# Patient Record
Sex: Male | Born: 1990 | Race: Black or African American | Hispanic: No | Marital: Single | Smoking: Current every day smoker
Health system: Southern US, Community
[De-identification: ages and names within clinical notes are randomized; demographics above are authoritative.]

## PROBLEM LIST (undated history)

## (undated) DIAGNOSIS — W3400XA Accidental discharge from unspecified firearms or gun, initial encounter: Secondary | ICD-10-CM

## (undated) HISTORY — PX: MOUTH SURGERY: SHX715

---

## 2017-03-22 ENCOUNTER — Encounter (HOSPITAL_COMMUNITY)
Admission: EM | Disposition: A | Discharge status: Home Health | Payer: Self-pay | Source: Home / Self Care | Attending: Student

## 2017-03-22 ENCOUNTER — Inpatient Hospital Stay (HOSPITAL_COMMUNITY)
Admission: EM | Admit: 2017-03-22 | Discharge: 2017-03-24 | DRG: 482 | Disposition: A | Discharge status: Home Health | Payer: 59 | Attending: Student | Admitting: Student

## 2017-03-22 ENCOUNTER — Inpatient Hospital Stay (HOSPITAL_COMMUNITY): Payer: 59 | Admitting: Certified Registered"

## 2017-03-22 ENCOUNTER — Inpatient Hospital Stay (HOSPITAL_COMMUNITY): Payer: 59

## 2017-03-22 ENCOUNTER — Emergency Department (HOSPITAL_COMMUNITY): Payer: 59

## 2017-03-22 ENCOUNTER — Other Ambulatory Visit: Payer: Self-pay

## 2017-03-22 ENCOUNTER — Encounter (HOSPITAL_COMMUNITY): Payer: Self-pay | Admitting: Emergency Medicine

## 2017-03-22 DIAGNOSIS — S41132A Puncture wound without foreign body of left upper arm, initial encounter: Secondary | ICD-10-CM | POA: Diagnosis present

## 2017-03-22 DIAGNOSIS — S72352B Displaced comminuted fracture of shaft of left femur, initial encounter for open fracture type I or II: Secondary | ICD-10-CM | POA: Diagnosis not present

## 2017-03-22 DIAGNOSIS — S8992XA Unspecified injury of left lower leg, initial encounter: Secondary | ICD-10-CM | POA: Diagnosis not present

## 2017-03-22 DIAGNOSIS — F17200 Nicotine dependence, unspecified, uncomplicated: Secondary | ICD-10-CM | POA: Diagnosis present

## 2017-03-22 DIAGNOSIS — S72352A Displaced comminuted fracture of shaft of left femur, initial encounter for closed fracture: Secondary | ICD-10-CM | POA: Diagnosis not present

## 2017-03-22 DIAGNOSIS — S79002A Unspecified physeal fracture of upper end of left femur, initial encounter for closed fracture: Secondary | ICD-10-CM | POA: Diagnosis not present

## 2017-03-22 DIAGNOSIS — S7292XB Unspecified fracture of left femur, initial encounter for open fracture type I or II: Secondary | ICD-10-CM

## 2017-03-22 DIAGNOSIS — T1490XA Injury, unspecified, initial encounter: Secondary | ICD-10-CM

## 2017-03-22 DIAGNOSIS — S51802A Unspecified open wound of left forearm, initial encounter: Secondary | ICD-10-CM | POA: Diagnosis not present

## 2017-03-22 DIAGNOSIS — S59902A Unspecified injury of left elbow, initial encounter: Secondary | ICD-10-CM | POA: Diagnosis not present

## 2017-03-22 DIAGNOSIS — R410 Disorientation, unspecified: Secondary | ICD-10-CM | POA: Diagnosis not present

## 2017-03-22 DIAGNOSIS — W3400XA Accidental discharge from unspecified firearms or gun, initial encounter: Secondary | ICD-10-CM

## 2017-03-22 DIAGNOSIS — S71102A Unspecified open wound, left thigh, initial encounter: Secondary | ICD-10-CM | POA: Diagnosis not present

## 2017-03-22 DIAGNOSIS — I959 Hypotension, unspecified: Secondary | ICD-10-CM | POA: Diagnosis not present

## 2017-03-22 DIAGNOSIS — T148XXA Other injury of unspecified body region, initial encounter: Secondary | ICD-10-CM | POA: Diagnosis not present

## 2017-03-22 DIAGNOSIS — S81802A Unspecified open wound, left lower leg, initial encounter: Secondary | ICD-10-CM | POA: Diagnosis not present

## 2017-03-22 DIAGNOSIS — S7292XA Unspecified fracture of left femur, initial encounter for closed fracture: Secondary | ICD-10-CM

## 2017-03-22 DIAGNOSIS — S51842A Puncture wound with foreign body of left forearm, initial encounter: Secondary | ICD-10-CM | POA: Diagnosis not present

## 2017-03-22 DIAGNOSIS — S51832A Puncture wound without foreign body of left forearm, initial encounter: Secondary | ICD-10-CM

## 2017-03-22 DIAGNOSIS — S72002A Fracture of unspecified part of neck of left femur, initial encounter for closed fracture: Secondary | ICD-10-CM | POA: Diagnosis not present

## 2017-03-22 DIAGNOSIS — M79652 Pain in left thigh: Secondary | ICD-10-CM | POA: Diagnosis present

## 2017-03-22 HISTORY — PX: FEMUR IM NAIL: SHX1597

## 2017-03-22 HISTORY — DX: Accidental discharge from unspecified firearms or gun, initial encounter: W34.00XA

## 2017-03-22 LAB — CBC WITH DIFFERENTIAL/PLATELET
BASOS ABS: 0 10*3/uL (ref 0.0–0.1)
BASOS PCT: 0 %
EOS ABS: 0 10*3/uL (ref 0.0–0.7)
Eosinophils Relative: 0 %
HCT: 35.8 % — ABNORMAL LOW (ref 39.0–52.0)
HEMOGLOBIN: 12 g/dL — AB (ref 13.0–17.0)
LYMPHS PCT: 12 %
Lymphs Abs: 1.2 10*3/uL (ref 0.7–4.0)
MCH: 30.7 pg (ref 26.0–34.0)
MCHC: 33.5 g/dL (ref 30.0–36.0)
MCV: 91.6 fL (ref 78.0–100.0)
Monocytes Absolute: 0.7 10*3/uL (ref 0.1–1.0)
Monocytes Relative: 6 %
Neutro Abs: 8.5 10*3/uL — ABNORMAL HIGH (ref 1.7–7.7)
Neutrophils Relative %: 82 %
Platelets: 216 10*3/uL (ref 150–400)
RBC: 3.91 MIL/uL — AB (ref 4.22–5.81)
RDW: 15.1 % (ref 11.5–15.5)
WBC: 10.5 10*3/uL (ref 4.0–10.5)

## 2017-03-22 LAB — SURGICAL PCR SCREEN
MRSA, PCR: NEGATIVE
STAPHYLOCOCCUS AUREUS: NEGATIVE

## 2017-03-22 LAB — BASIC METABOLIC PANEL
Anion gap: 14 (ref 5–15)
BUN: 7 mg/dL (ref 6–20)
CHLORIDE: 104 mmol/L (ref 101–111)
CO2: 20 mmol/L — AB (ref 22–32)
CREATININE: 1.07 mg/dL (ref 0.61–1.24)
Calcium: 8 mg/dL — ABNORMAL LOW (ref 8.9–10.3)
GFR calc Af Amer: >60 mL/min (ref >60)
GFR calc non Af Amer: >60 mL/min (ref >60)
Glucose, Bld: 87 mg/dL (ref 65–99)
Potassium: 3.7 mmol/L (ref 3.5–5.1)
Sodium: 138 mmol/L (ref 135–145)

## 2017-03-22 LAB — ETHANOL: Alcohol, Ethyl (B): 118 mg/dL — ABNORMAL HIGH (ref <10)

## 2017-03-22 SURGERY — INSERTION, INTRAMEDULLARY ROD, FEMUR
Anesthesia: General | Site: Leg Upper | Laterality: Left

## 2017-03-22 MED ORDER — SUGAMMADEX SODIUM 200 MG/2ML IV SOLN
INTRAVENOUS | Status: AC
  Filled 2017-03-22: qty 2

## 2017-03-22 MED ORDER — CEFAZOLIN SODIUM-DEXTROSE 2-4 GM/100ML-% IV SOLN
INTRAVENOUS | Status: AC
  Filled 2017-03-22: qty 100

## 2017-03-22 MED ORDER — CEFAZOLIN SODIUM-DEXTROSE 2-4 GM/100ML-% IV SOLN
2.0000 g | INTRAVENOUS | Status: AC
  Administered 2017-03-22: 2 g via INTRAVENOUS

## 2017-03-22 MED ORDER — SUCCINYLCHOLINE CHLORIDE 20 MG/ML IJ SOLN
INTRAMUSCULAR | Status: DC | PRN
  Administered 2017-03-22: 100 mg via INTRAVENOUS

## 2017-03-22 MED ORDER — ACETAMINOPHEN 650 MG RE SUPP: 650.0000 mg | Freq: Four times a day (QID) | RECTAL | Status: DC | PRN

## 2017-03-22 MED ORDER — CEFAZOLIN SODIUM-DEXTROSE 2-4 GM/100ML-% IV SOLN
2.0000 g | Freq: Three times a day (TID) | INTRAVENOUS | Status: AC
  Administered 2017-03-22 – 2017-03-23 (×3): 2 g via INTRAVENOUS
  Filled 2017-03-22 (×3): qty 100

## 2017-03-22 MED ORDER — LIDOCAINE HCL (CARDIAC) 20 MG/ML IV SOLN
INTRAVENOUS | Status: DC | PRN
  Administered 2017-03-22: 80 mg via INTRAVENOUS

## 2017-03-22 MED ORDER — PROPOFOL 10 MG/ML IV BOLUS
INTRAVENOUS | Status: AC
  Filled 2017-03-22: qty 20

## 2017-03-22 MED ORDER — HYDROMORPHONE HCL 1 MG/ML IJ SOLN: 1.0000 mg | Freq: Once | INTRAMUSCULAR | Status: DC

## 2017-03-22 MED ORDER — DIPHENHYDRAMINE HCL 12.5 MG/5ML PO ELIX: 12.5000 mg | ORAL_SOLUTION | ORAL | Status: DC | PRN

## 2017-03-22 MED ORDER — VANCOMYCIN HCL 500 MG IV SOLR
INTRAVENOUS | Status: AC
  Filled 2017-03-22: qty 500

## 2017-03-22 MED ORDER — PROMETHAZINE HCL 25 MG/ML IJ SOLN: 6.2500 mg | INTRAMUSCULAR | Status: DC | PRN

## 2017-03-22 MED ORDER — ROCURONIUM BROMIDE 100 MG/10ML IV SOLN
INTRAVENOUS | Status: DC | PRN
  Administered 2017-03-22: 30 mg via INTRAVENOUS

## 2017-03-22 MED ORDER — FENTANYL CITRATE (PF) 250 MCG/5ML IJ SOLN
INTRAMUSCULAR | Status: AC
  Filled 2017-03-22: qty 5

## 2017-03-22 MED ORDER — VANCOMYCIN HCL 500 MG IV SOLR
INTRAVENOUS | Status: DC | PRN
  Administered 2017-03-22: 500 mg via TOPICAL

## 2017-03-22 MED ORDER — PROPOFOL 10 MG/ML IV BOLUS
INTRAVENOUS | Status: DC | PRN
  Administered 2017-03-22: 180 mg via INTRAVENOUS

## 2017-03-22 MED ORDER — 0.9 % SODIUM CHLORIDE (POUR BTL) OPTIME
TOPICAL | Status: DC | PRN
  Administered 2017-03-22: 1000 mL

## 2017-03-22 MED ORDER — ACETAMINOPHEN 325 MG PO TABS
650.0000 mg | ORAL_TABLET | Freq: Four times a day (QID) | ORAL | Status: DC | PRN
  Administered 2017-03-23 – 2017-03-24 (×3): 650 mg via ORAL
  Filled 2017-03-22 (×3): qty 2

## 2017-03-22 MED ORDER — PHENYLEPHRINE 40 MCG/ML (10ML) SYRINGE FOR IV PUSH (FOR BLOOD PRESSURE SUPPORT)
PREFILLED_SYRINGE | INTRAVENOUS | Status: DC | PRN
  Administered 2017-03-22: 40 ug via INTRAVENOUS
  Administered 2017-03-22: 80 ug via INTRAVENOUS

## 2017-03-22 MED ORDER — DEXAMETHASONE SODIUM PHOSPHATE 10 MG/ML IJ SOLN
INTRAMUSCULAR | Status: DC | PRN
  Administered 2017-03-22: 4 mg via INTRAVENOUS

## 2017-03-22 MED ORDER — MIDAZOLAM HCL 2 MG/2ML IJ SOLN
INTRAMUSCULAR | Status: AC
  Filled 2017-03-22: qty 2

## 2017-03-22 MED ORDER — MIDAZOLAM HCL 2 MG/2ML IJ SOLN
INTRAMUSCULAR | Status: DC | PRN
  Administered 2017-03-22: 2 mg via INTRAVENOUS

## 2017-03-22 MED ORDER — FENTANYL CITRATE (PF) 100 MCG/2ML IJ SOLN
INTRAMUSCULAR | Status: DC | PRN
  Administered 2017-03-22 (×2): 50 ug via INTRAVENOUS
  Administered 2017-03-22: 150 ug via INTRAVENOUS

## 2017-03-22 MED ORDER — ONDANSETRON HCL 4 MG PO TABS: 4.0000 mg | ORAL_TABLET | Freq: Four times a day (QID) | ORAL | Status: DC | PRN

## 2017-03-22 MED ORDER — SUGAMMADEX SODIUM 200 MG/2ML IV SOLN
INTRAVENOUS | Status: DC | PRN
  Administered 2017-03-22: 150 mg via INTRAVENOUS

## 2017-03-22 MED ORDER — ONDANSETRON HCL 4 MG/2ML IJ SOLN
4.0000 mg | Freq: Four times a day (QID) | INTRAMUSCULAR | Status: DC | PRN
  Administered 2017-03-22: 4 mg via INTRAVENOUS

## 2017-03-22 MED ORDER — METHOCARBAMOL 1000 MG/10ML IJ SOLN
500.0000 mg | Freq: Four times a day (QID) | INTRAVENOUS | Status: DC | PRN
  Filled 2017-03-22: qty 5

## 2017-03-22 MED ORDER — FENTANYL CITRATE (PF) 100 MCG/2ML IJ SOLN
INTRAMUSCULAR | Status: AC
  Filled 2017-03-22: qty 2

## 2017-03-22 MED ORDER — MORPHINE SULFATE (PF) 4 MG/ML IV SOLN
2.0000 mg | INTRAVENOUS | Status: DC | PRN
  Administered 2017-03-22 (×3): 2 mg via INTRAVENOUS
  Filled 2017-03-22 (×3): qty 1

## 2017-03-22 MED ORDER — PHENYLEPHRINE HCL 10 MG/ML IJ SOLN
INTRAVENOUS | Status: DC | PRN
  Administered 2017-03-22: 25 ug/min via INTRAVENOUS

## 2017-03-22 MED ORDER — FENTANYL CITRATE (PF) 100 MCG/2ML IJ SOLN
25.0000 ug | INTRAMUSCULAR | Status: DC | PRN
  Administered 2017-03-22: 50 ug via INTRAVENOUS

## 2017-03-22 MED ORDER — METHOCARBAMOL 500 MG PO TABS
500.0000 mg | ORAL_TABLET | Freq: Four times a day (QID) | ORAL | Status: DC | PRN
  Administered 2017-03-22 – 2017-03-24 (×5): 500 mg via ORAL
  Filled 2017-03-22 (×5): qty 1

## 2017-03-22 MED ORDER — OXYCODONE HCL 5 MG PO TABS
5.0000 mg | ORAL_TABLET | ORAL | Status: DC | PRN
  Administered 2017-03-22 – 2017-03-24 (×8): 10 mg via ORAL
  Filled 2017-03-22 (×8): qty 2

## 2017-03-22 MED ORDER — DEXAMETHASONE SODIUM PHOSPHATE 10 MG/ML IJ SOLN
INTRAMUSCULAR | Status: AC
  Filled 2017-03-22: qty 1

## 2017-03-22 MED ORDER — LACTATED RINGERS IV SOLN
INTRAVENOUS | Status: DC
  Administered 2017-03-22 (×2): via INTRAVENOUS
  Administered 2017-03-22: 10 mL via INTRAVENOUS

## 2017-03-22 MED ORDER — MUPIROCIN 2 % EX OINT
1.0000 "application " | TOPICAL_OINTMENT | Freq: Two times a day (BID) | CUTANEOUS | Status: DC
  Administered 2017-03-23 – 2017-03-24 (×3): 1 via NASAL
  Filled 2017-03-22: qty 22

## 2017-03-22 SURGICAL SUPPLY — 57 items
BANDAGE ACE 4X5 VEL STRL LF (GAUZE/BANDAGES/DRESSINGS) IMPLANT
BANDAGE ACE 6X5 VEL STRL LF (GAUZE/BANDAGES/DRESSINGS) IMPLANT
BANDAGE ELASTIC 6 VELCRO ST LF (GAUZE/BANDAGES/DRESSINGS) IMPLANT
BIT DRILL 4.2 (DRILL) ×1 IMPLANT
BIT DRILL FLUTED FEMUR 4.2/3 (BIT) ×3 IMPLANT
BLADE SURG 10 STRL SS (BLADE) ×3 IMPLANT
BNDG COHESIVE 4X5 TAN STRL (GAUZE/BANDAGES/DRESSINGS) ×3 IMPLANT
BNDG COHESIVE 6X5 TAN STRL LF (GAUZE/BANDAGES/DRESSINGS) ×3 IMPLANT
BRUSH SCRUB SURG 4.25 DISP (MISCELLANEOUS) ×6 IMPLANT
CHLORAPREP W/TINT 26ML (MISCELLANEOUS) ×3 IMPLANT
COVER SURGICAL LIGHT HANDLE (MISCELLANEOUS) ×3 IMPLANT
DRAPE C-ARM 35X43 STRL (DRAPES) ×3 IMPLANT
DRAPE C-ARMOR (DRAPES) ×3 IMPLANT
DRAPE HALF SHEET 40X57 (DRAPES) ×6 IMPLANT
DRAPE IMP U-DRAPE 54X76 (DRAPES) ×6 IMPLANT
DRAPE INCISE IOBAN 66X45 STRL (DRAPES) ×3 IMPLANT
DRAPE ORTHO SPLIT 77X108 STRL (DRAPES) ×4
DRAPE SURG 17X23 STRL (DRAPES) ×3 IMPLANT
DRAPE SURG ORHT 6 SPLT 77X108 (DRAPES) ×2 IMPLANT
DRAPE U-SHAPE 47X51 STRL (DRAPES) ×3 IMPLANT
DRILL 4.2 (DRILL) ×3
DRSG MEPILEX BORDER 4X4 (GAUZE/BANDAGES/DRESSINGS) ×15 IMPLANT
DRSG MEPILEX BORDER 4X8 (GAUZE/BANDAGES/DRESSINGS) IMPLANT
ELECT REM PT RETURN 9FT ADLT (ELECTROSURGICAL) ×3
ELECTRODE REM PT RTRN 9FT ADLT (ELECTROSURGICAL) ×1 IMPLANT
GLOVE BIO SURGEON STRL SZ7.5 (GLOVE) ×18 IMPLANT
GLOVE BIOGEL PI IND STRL 7.5 (GLOVE) ×1 IMPLANT
GLOVE BIOGEL PI INDICATOR 7.5 (GLOVE) ×2
GOWN STRL REUS W/ TWL LRG LVL3 (GOWN DISPOSABLE) ×3 IMPLANT
GOWN STRL REUS W/TWL LRG LVL3 (GOWN DISPOSABLE) ×6
GOWN STRL REUS W/TWL XL LVL3 (GOWN DISPOSABLE) ×3 IMPLANT
GUIDEWIRE 3.2X400 (WIRE) ×9 IMPLANT
KIT BASIN OR (CUSTOM PROCEDURE TRAY) ×3 IMPLANT
KIT ROOM TURNOVER OR (KITS) ×3 IMPLANT
MANIFOLD NEPTUNE II (INSTRUMENTS) ×3 IMPLANT
NAIL CANN FRN 11X420MM LEFT (Nail) ×3 IMPLANT
NS IRRIG 1000ML POUR BTL (IV SOLUTION) ×3 IMPLANT
PACK GENERAL/GYN (CUSTOM PROCEDURE TRAY) ×3 IMPLANT
PAD ARMBOARD 7.5X6 YLW CONV (MISCELLANEOUS) ×6 IMPLANT
ROD REAMER STRT BALL 3.0X950 (MISCELLANEOUS) ×3 IMPLANT
SCREW 6.5MM W/T25 STARDRIVE (BIT) ×3 IMPLANT
SCREW LOCK STAR 5X42 (Screw) ×3 IMPLANT
SCREW LOCKING 5.0MMX40MM (Screw) ×3 IMPLANT
SCREW LOCKING 5.0X48MM (Screw) ×3 IMPLANT
SCREW RECON 6.5X100MM (Screw) ×3 IMPLANT
STAPLER VISISTAT 35W (STAPLE) ×3 IMPLANT
STOCKINETTE IMPERVIOUS LG (DRAPES) ×3 IMPLANT
SUT ETHILON 3 0 PS 1 (SUTURE) ×9 IMPLANT
SUT MNCRL AB 3-0 PS2 18 (SUTURE) IMPLANT
SUT VIC AB 0 CT1 27 (SUTURE)
SUT VIC AB 0 CT1 27XBRD ANBCTR (SUTURE) IMPLANT
SUT VIC AB 2-0 CT1 27 (SUTURE) ×4
SUT VIC AB 2-0 CT1 TAPERPNT 27 (SUTURE) ×2 IMPLANT
TOWEL OR 17X24 6PK STRL BLUE (TOWEL DISPOSABLE) ×6 IMPLANT
TOWEL OR 17X26 10 PK STRL BLUE (TOWEL DISPOSABLE) ×6 IMPLANT
UNDERPAD 30X30 (UNDERPADS AND DIAPERS) ×3 IMPLANT
WATER STERILE IRR 1000ML POUR (IV SOLUTION) ×3 IMPLANT
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
## 2017-03-22 NOTE — Anesthesia Postprocedure Evaluation (Signed)
Anesthesia Post Note  Patient: Louis Roberts  Procedure(s) Performed: INTRAMEDULLARY (IM) NAIL FEMORAL (Left Leg Upper)     Patient location during evaluation: PACU Anesthesia Type: General Level of consciousness: awake and alert Pain management: pain level controlled Vital Signs Assessment: post-procedure vital signs reviewed and stable Respiratory status: spontaneous breathing, nonlabored ventilation and respiratory function stable Cardiovascular status: blood pressure returned to baseline and stable Postop Assessment: no apparent nausea or vomiting Anesthetic complications: no    Last Vitals:  Vitals:   03/22/17 1554 03/22/17 1615  BP: (!) 146/84 (!) 156/61  Pulse: (!) 58 61  Resp: 14 16  Temp:  37.4 C  SpO2: 95% 100%    Last Pain:  Vitals:   03/22/17 1615  TempSrc: Oral  PainSc:                  Cecile HearingStephen Edward Kaneisha Ellenberger

## 2017-03-22 NOTE — Transfer of Care (Signed)
Immediate Anesthesia Transfer of Care Note  Patient: Louis RhodesJeronico D XXXHairston  Procedure(s) Performed: INTRAMEDULLARY (IM) NAIL FEMORAL (Left Leg Upper)  Patient Location: PACU  Anesthesia Type:General  Level of Consciousness: sedated and patient cooperative  Airway & Oxygen Therapy: Patient Spontanous Breathing  Post-op Assessment: Report given to RN and Post -op Vital signs reviewed and stable  Post vital signs: Reviewed  Last Vitals: 144/75, 74, 16, 97% RA Vitals:   03/22/17 0823 03/22/17 1454  BP: (!) 143/65 (!) 144/75  Pulse: 63 85  Resp: 16 17  Temp: 37 C 36.6 C  SpO2: 99% 95%    Last Pain:  Vitals:   03/22/17 0823  TempSrc: Axillary  PainSc:          Complications: No apparent anesthesia complications
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
## 2017-03-22 NOTE — ED Notes (Signed)
Mother at bedside speaking with Ortho MD

## 2017-03-22 NOTE — ED Notes (Signed)
Dr. Judd Lienelo notified of request for pain medication.
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
## 2017-03-22 NOTE — Anesthesia Procedure Notes (Signed)
Procedure Name: Intubation Date/Time: 03/22/2017 12:22 PM Performed by: Sammie Bench, CRNA Pre-anesthesia Checklist: Patient identified, Emergency Drugs available, Suction available and Patient being monitored Patient Re-evaluated:Patient Re-evaluated prior to induction Oxygen Delivery Method: Circle System Utilized Preoxygenation: Pre-oxygenation with 100% oxygen Induction Type: IV induction, Rapid sequence and Cricoid Pressure applied Ventilation: Mask ventilation without difficulty Laryngoscope Size: Mac and 3 Grade View: Grade II Tube type: Oral Tube size: 7.5 mm Number of attempts: 1 Airway Equipment and Method: Stylet and Oral airway Placement Confirmation: ETT inserted through vocal cords under direct vision,  positive ETCO2 and breath sounds checked- equal and bilateral Tube secured with: Tape Dental Injury: Teeth and Oropharynx as per pre-operative assessment

## 2017-03-22 NOTE — ED Notes (Signed)
Xray at bedside for portable xrays. 

## 2017-03-22 NOTE — ED Notes (Signed)
No time set for case in OR at this time.

## 2017-03-23 ENCOUNTER — Encounter (HOSPITAL_COMMUNITY): Payer: Self-pay | Admitting: *Deleted

## 2017-03-23 LAB — CBC
HCT: 31.1 % — ABNORMAL LOW (ref 39.0–52.0)
HEMOGLOBIN: 10.5 g/dL — AB (ref 13.0–17.0)
MCH: 30.3 pg (ref 26.0–34.0)
MCHC: 33.8 g/dL (ref 30.0–36.0)
MCV: 89.6 fL (ref 78.0–100.0)
Platelets: 208 10*3/uL (ref 150–400)
RBC: 3.47 MIL/uL — ABNORMAL LOW (ref 4.22–5.81)
RDW: 14.2 % (ref 11.5–15.5)
WBC: 10 10*3/uL (ref 4.0–10.5)

## 2017-03-23 LAB — HIV ANTIBODY (ROUTINE TESTING W REFLEX): HIV Screen 4th Generation wRfx: NONREACTIVE

## 2017-03-23 MED ORDER — ASPIRIN 325 MG PO TABS
325.0000 mg | ORAL_TABLET | Freq: Every day | ORAL | Status: DC
  Administered 2017-03-23 – 2017-03-24 (×2): 325 mg via ORAL
  Filled 2017-03-23 (×2): qty 1

## 2017-03-23 NOTE — Progress Notes (Signed)
Orthopaedic Trauma Service (OTS)  1 Day Post-Op Procedure(s) (LRB): INTRAMEDULLARY (IM) NAIL FEMORAL (Left)  Subjective: Patient reports pain as moderate.    Objective: Current Vitals Blood pressure 132/74, pulse 69, temperature 98.9 F (37.2 C), temperature source Oral, resp. rate 16, height 6\' 1"  (1.854 m), weight 74.4 kg (164 lb), SpO2 97 %. Vital signs in last 24 hours: Temp:  [97.9 F (36.6 C)-99.3 F (37.4 C)] 98.9 F (37.2 C) (03/17 0514) Pulse Rate:  [55-85] 69 (03/17 0514) Resp:  [13-17] 16 (03/17 0514) BP: (132-156)/(61-84) 132/74 (03/17 0514) SpO2:  [93 %-100 %] 97 % (03/17 0514)  Intake/Output from previous day: 03/16 0701 - 03/17 0700 In: 640 [P.O.:240; I.V.:400] Out: 2380 [Urine:2130; Blood:250]  LABS Recent Labs    03/22/17 0442 03/23/17 0932  HGB 12.0* 10.5*   Recent Labs    03/22/17 0442 03/23/17 0932  WBC 10.5 10.0  RBC 3.91* 3.47*  HCT 35.8* 31.1*  PLT 216 208   Recent Labs    03/22/17 0442  NA 138  K 3.7  CL 104  CO2 20*  BUN 7  CREATININE 1.07  GLUCOSE 87  CALCIUM 8.0*   No results for input(s): LABPT, INR in the last 72 hours.   Physical Exam L arm drsg with scant drainage, edema mild LLE  Dressing intact, min drainage, dry  Edema/ swelling controlled  Sens: DPN, SPN, TN intact  Motor: EHL, FHL, and lessor toe ext and flex all intact grossly  Brisk cap refill, warm to touch  Assessment/Plan: 1 Day Post-Op Procedure(s) (LRB): INTRAMEDULLARY (IM) NAIL FEMORAL (Left) 1. PT/OT TDWB 2. DVT proph Other (comment) ASA 3. F/u 8-14 days if d/c'd today; tomorrow anticipated  Myrene GalasMichael Dwaine Pringle, MD [facility] [phone] (p)
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
## 2017-03-24 ENCOUNTER — Encounter (HOSPITAL_COMMUNITY): Payer: Self-pay | Admitting: General Practice

## 2017-03-24 LAB — CBC WITH DIFFERENTIAL/PLATELET
Basophils Absolute: 0 10*3/uL (ref 0.0–0.1)
Basophils Relative: 0 %
Eosinophils Absolute: 0.1 10*3/uL (ref 0.0–0.7)
Eosinophils Relative: 1 %
HEMATOCRIT: 27 % — AB (ref 39.0–52.0)
Hemoglobin: 9 g/dL — ABNORMAL LOW (ref 13.0–17.0)
LYMPHS ABS: 1.4 10*3/uL (ref 0.7–4.0)
LYMPHS PCT: 18 %
MCH: 30.3 pg (ref 26.0–34.0)
MCHC: 33.3 g/dL (ref 30.0–36.0)
MCV: 90.9 fL (ref 78.0–100.0)
Monocytes Absolute: 0.8 10*3/uL (ref 0.1–1.0)
Monocytes Relative: 10 %
NEUTROS ABS: 5.8 10*3/uL (ref 1.7–7.7)
NEUTROS PCT: 71 %
Platelets: 179 10*3/uL (ref 150–400)
RBC: 2.97 MIL/uL — AB (ref 4.22–5.81)
RDW: 14.1 % (ref 11.5–15.5)
WBC: 8.2 10*3/uL (ref 4.0–10.5)

## 2017-03-24 MED ORDER — METHOCARBAMOL 750 MG PO TABS: 750.0000 mg | ORAL_TABLET | Freq: Four times a day (QID) | ORAL | 0 refills | Status: DC | PRN

## 2017-03-24 MED ORDER — METHOCARBAMOL 750 MG PO TABS: 750.0000 mg | ORAL_TABLET | Freq: Four times a day (QID) | ORAL | 0 refills | Status: AC | PRN

## 2017-03-24 MED ORDER — OXYCODONE-ACETAMINOPHEN 5-325 MG PO TABS: 1.0000 | ORAL_TABLET | ORAL | 0 refills | Status: AC | PRN

## 2017-03-24 MED ORDER — ASPIRIN 325 MG PO TABS: 325.0000 mg | ORAL_TABLET | Freq: Every day | ORAL | 0 refills | Status: DC

## 2017-03-24 MED ORDER — OXYCODONE-ACETAMINOPHEN 5-325 MG PO TABS: 1.0000 | ORAL_TABLET | ORAL | 0 refills | Status: DC | PRN

## 2017-03-24 MED ORDER — ASPIRIN 325 MG PO TABS: 325.0000 mg | ORAL_TABLET | Freq: Every day | ORAL | 0 refills | Status: AC
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
## 2017-03-24 NOTE — Progress Notes (Signed)
This nurse called to PT room d/t pt feeling light headed and needing to sit during treatment.  Orthostatic BP's taken see flow sheet.  Pt awake, alert laying in bed with mother at bedside.  [id number]
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
## 2017-03-24 NOTE — Progress Notes (Signed)
Orthopedic Tech Progress Note Patient Details:  [name] February 02, 1990 [id number]  Ortho Devices Type of Ortho Device: Crutches   Post Interventions Instructions Provided: Care of device   Saul FordyceJennifer C Karrigan Messamore 03/24/2017, 1:17 PM
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
## 2017-03-25 ENCOUNTER — Other Ambulatory Visit: Payer: Self-pay | Admitting: *Deleted

## 2017-03-25 ENCOUNTER — Encounter: Payer: Self-pay | Admitting: *Deleted
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
## 2017-04-01 DIAGNOSIS — S81802A Unspecified open wound, left lower leg, initial encounter: Secondary | ICD-10-CM | POA: Diagnosis not present

## 2017-04-01 DIAGNOSIS — S7292XB Unspecified fracture of left femur, initial encounter for open fracture type I or II: Secondary | ICD-10-CM | POA: Diagnosis not present

## 2017-04-04 DIAGNOSIS — S7292XB Unspecified fracture of left femur, initial encounter for open fracture type I or II: Secondary | ICD-10-CM | POA: Diagnosis not present

## 2017-04-04 DIAGNOSIS — S81802A Unspecified open wound, left lower leg, initial encounter: Secondary | ICD-10-CM | POA: Diagnosis not present

## 2017-04-07 DIAGNOSIS — S7292XB Unspecified fracture of left femur, initial encounter for open fracture type I or II: Secondary | ICD-10-CM | POA: Diagnosis not present

## 2017-04-07 DIAGNOSIS — S81802A Unspecified open wound, left lower leg, initial encounter: Secondary | ICD-10-CM | POA: Diagnosis not present

## 2017-04-10 DIAGNOSIS — S81802A Unspecified open wound, left lower leg, initial encounter: Secondary | ICD-10-CM | POA: Diagnosis not present

## 2017-04-10 DIAGNOSIS — S7292XB Unspecified fracture of left femur, initial encounter for open fracture type I or II: Secondary | ICD-10-CM | POA: Diagnosis not present

## 2017-04-14 DIAGNOSIS — S72352F Displaced comminuted fracture of shaft of left femur, subsequent encounter for open fracture type IIIA, IIIB, or IIIC with routine healing: Secondary | ICD-10-CM | POA: Diagnosis not present

## 2017-04-16 DIAGNOSIS — S81802A Unspecified open wound, left lower leg, initial encounter: Secondary | ICD-10-CM | POA: Diagnosis not present

## 2017-04-16 DIAGNOSIS — S7292XB Unspecified fracture of left femur, initial encounter for open fracture type I or II: Secondary | ICD-10-CM | POA: Diagnosis not present

## 2017-04-17 DIAGNOSIS — S7292XB Unspecified fracture of left femur, initial encounter for open fracture type I or II: Secondary | ICD-10-CM | POA: Diagnosis not present

## 2017-04-17 DIAGNOSIS — S81802A Unspecified open wound, left lower leg, initial encounter: Secondary | ICD-10-CM | POA: Diagnosis not present

## 2017-04-23 DIAGNOSIS — S81802A Unspecified open wound, left lower leg, initial encounter: Secondary | ICD-10-CM | POA: Diagnosis not present

## 2017-04-23 DIAGNOSIS — S7292XB Unspecified fracture of left femur, initial encounter for open fracture type I or II: Secondary | ICD-10-CM | POA: Diagnosis not present

## 2017-04-24 DIAGNOSIS — S7292XB Unspecified fracture of left femur, initial encounter for open fracture type I or II: Secondary | ICD-10-CM | POA: Diagnosis not present

## 2017-04-24 DIAGNOSIS — S81802A Unspecified open wound, left lower leg, initial encounter: Secondary | ICD-10-CM | POA: Diagnosis not present

## 2017-04-30 DIAGNOSIS — S7292XB Unspecified fracture of left femur, initial encounter for open fracture type I or II: Secondary | ICD-10-CM | POA: Diagnosis not present

## 2017-04-30 DIAGNOSIS — S81802A Unspecified open wound, left lower leg, initial encounter: Secondary | ICD-10-CM | POA: Diagnosis not present

## 2017-05-01 DIAGNOSIS — S7292XB Unspecified fracture of left femur, initial encounter for open fracture type I or II: Secondary | ICD-10-CM | POA: Diagnosis not present

## 2017-05-01 DIAGNOSIS — S81802A Unspecified open wound, left lower leg, initial encounter: Secondary | ICD-10-CM | POA: Diagnosis not present

## 2017-05-07 DIAGNOSIS — W3400XS Accidental discharge from unspecified firearms or gun, sequela: Secondary | ICD-10-CM | POA: Diagnosis not present

## 2017-05-07 DIAGNOSIS — Z713 Dietary counseling and surveillance: Secondary | ICD-10-CM | POA: Diagnosis not present

## 2017-05-07 DIAGNOSIS — M79606 Pain in leg, unspecified: Secondary | ICD-10-CM | POA: Diagnosis not present

## 2017-05-07 DIAGNOSIS — Z299 Encounter for prophylactic measures, unspecified: Secondary | ICD-10-CM | POA: Diagnosis not present

## 2017-05-07 DIAGNOSIS — Z6823 Body mass index (BMI) 23.0-23.9, adult: Secondary | ICD-10-CM | POA: Diagnosis not present

## 2017-05-08 DIAGNOSIS — S7292XB Unspecified fracture of left femur, initial encounter for open fracture type I or II: Secondary | ICD-10-CM | POA: Diagnosis not present

## 2017-05-08 DIAGNOSIS — S81802A Unspecified open wound, left lower leg, initial encounter: Secondary | ICD-10-CM | POA: Diagnosis not present

## 2017-05-09 DIAGNOSIS — S7292XB Unspecified fracture of left femur, initial encounter for open fracture type I or II: Secondary | ICD-10-CM | POA: Diagnosis not present

## 2017-05-09 DIAGNOSIS — S81802A Unspecified open wound, left lower leg, initial encounter: Secondary | ICD-10-CM | POA: Diagnosis not present

## 2017-05-13 DIAGNOSIS — S72352F Displaced comminuted fracture of shaft of left femur, subsequent encounter for open fracture type IIIA, IIIB, or IIIC with routine healing: Secondary | ICD-10-CM | POA: Diagnosis not present

## 2017-05-15 DIAGNOSIS — S7292XB Unspecified fracture of left femur, initial encounter for open fracture type I or II: Secondary | ICD-10-CM | POA: Diagnosis not present

## 2017-05-15 DIAGNOSIS — S81802A Unspecified open wound, left lower leg, initial encounter: Secondary | ICD-10-CM | POA: Diagnosis not present

## 2017-05-27 ENCOUNTER — Encounter (HOSPITAL_COMMUNITY): Payer: Self-pay

## 2017-05-27 ENCOUNTER — Ambulatory Visit (HOSPITAL_COMMUNITY): Payer: 59 | Attending: Student

## 2017-05-27 ENCOUNTER — Other Ambulatory Visit: Payer: Self-pay

## 2017-05-27 DIAGNOSIS — M6281 Muscle weakness (generalized): Secondary | ICD-10-CM | POA: Diagnosis not present

## 2017-05-27 DIAGNOSIS — R2689 Other abnormalities of gait and mobility: Secondary | ICD-10-CM | POA: Insufficient documentation

## 2017-05-27 DIAGNOSIS — M79605 Pain in left leg: Secondary | ICD-10-CM | POA: Insufficient documentation

## 2017-05-27 NOTE — Patient Instructions (Signed)
Supine Active Straight Leg Raise REPS: 10-20  SETS: 1-3  WEEKLY: 7x  DAILY: 1x Clinician Notes: squeeze your quad before raising your leg up Setup Begin lying on your back with one knee bent and your other leg straight. Movement Engaging your thigh muscles, slowly lift your straight leg until it is parallel with your other thigh, then lower it back to the starting position and repeat. Tip Make sure to keep your leg straight and do not let your back arch during the exercise. STEP 1 STEP 2 Sidelying Hip Abduction REPS: 10-20  SETS: 1-3  HOLD: 3 seconds  WEEKLY: 7x  DAILY: 1x Setup Begin lying on your side with your top leg straight and your bottom leg bent. Movement Lift your top leg up toward the ceiling, then slowly lower it back down and repeat. Tip Make sure to keep your leg straight and do not let your hips roll backward or forward during the exercise. STEP 1 STEP 2 Supine Hamstring Stretch with Strap REPS: 3  SETS: 1-3  HOLD: 30 seconds  WEEKLY: 7x  DAILY: 1x Setup Begin lying on your back with your legs straight, holding the end of a strap that is looped around one foot. Movement Use the strap to pull your leg up toward your body until you feel a gentle stretch in the back of your upper leg. Hold this position. Tip Make sure to keep your other leg straight on the ground during the stretch.
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
## 2017-06-03 ENCOUNTER — Ambulatory Visit (HOSPITAL_COMMUNITY): Payer: 59 | Admitting: Physical Therapy

## 2017-06-03 ENCOUNTER — Encounter (HOSPITAL_COMMUNITY): Payer: Self-pay | Admitting: Physical Therapy

## 2017-06-03 DIAGNOSIS — R2689 Other abnormalities of gait and mobility: Secondary | ICD-10-CM | POA: Diagnosis not present

## 2017-06-03 DIAGNOSIS — M79605 Pain in left leg: Secondary | ICD-10-CM | POA: Diagnosis not present

## 2017-06-03 DIAGNOSIS — M6281 Muscle weakness (generalized): Secondary | ICD-10-CM

## 2017-06-03 NOTE — Patient Instructions (Addendum)
Heel Raise: Bilateral (Standing)    Rise on balls of feet. Repeat _10__ times per set. Do __1__ sets per session. Do __2__ sessions per day.  http://orth.exer.us/38   Copyright  VHI. All rights reserved.  Forward Lunge    Standing with feet shoulder width apart and stomach tight, step forward with left leg. Repeat __10__ times per set. Do __1__ sets per session. Do _2___ sessions per day. 2 http://orth.exer.us/1146   Copyright  VHI. All rights reserved.  Side Lunge    Stand with knees slightly bent, stomach tight. Step to side with right leg. Repeat to left  Repeat 10____ times per set. Do __1__ sets per session. Do _2___ sessions per day.  http://orth.exer.us/1148   Copyright  VHI. All rights reserved.  Bridging    Slowly raise buttocks from floor, keeping stomach tight. Repeat __10__ times per set. Do __1__ sets per session. Do __2__ sessions per day.  http://orth.exer.us/1096   Copyright  VHI. All rights reserved.  Strengthening: Hip Abduction (Side-Lying)    Tighten muscles on front of left thigh, then lift leg _15___ inches from surface, keeping knee locked.  Repeat __10__ times per set. Do __1__ sets per session. Do __2__ sessions per day.  http://orth.exer.us/622   Copyright  VHI. All rights reserved.  Strengthening: Straight Leg Raise (Phase 1)    Tighten muscles on front of right thigh, then lift leg ___15_ inches from surface, keeping knee locked.  Repeat __10__ times per set. Do _1___ sets per session. Do __2__ sessions per day.  http://orth.exer.us/614   Copyright  VHI. All rights reserved.  Stretching: Hamstring (Supine)    Supporting right thigh behind knee, slowly straighten knee until stretch is felt in back of thigh. Hold __30__ seconds. Repeat ___3_ times per set. Do __1__ sets per session. Do _2___ sessions per day.  http://orth.exer.us/656   Copyright  VHI. All rights reserved.  Balance: Unilateral    Attempt to  balance on left leg, eyes open. Hold 30____ seconds. Repeat __5__ times per set. Do __1__ sets per session. Do ___2_ sessions per day. May use one or two finger hold   http://orth.exer.us/28   Copyright  VHI. All rights reserved.
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
## 2017-06-05 ENCOUNTER — Ambulatory Visit (HOSPITAL_COMMUNITY): Payer: 59

## 2017-06-05 ENCOUNTER — Encounter (HOSPITAL_COMMUNITY): Payer: Self-pay

## 2017-06-05 DIAGNOSIS — M79605 Pain in left leg: Secondary | ICD-10-CM | POA: Diagnosis not present

## 2017-06-05 DIAGNOSIS — M6281 Muscle weakness (generalized): Secondary | ICD-10-CM

## 2017-06-05 DIAGNOSIS — R2689 Other abnormalities of gait and mobility: Secondary | ICD-10-CM
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
## 2017-06-10 ENCOUNTER — Other Ambulatory Visit: Payer: Self-pay

## 2017-06-10 ENCOUNTER — Encounter (HOSPITAL_COMMUNITY): Payer: Self-pay

## 2017-06-10 ENCOUNTER — Ambulatory Visit (HOSPITAL_COMMUNITY): Payer: 59 | Attending: Student

## 2017-06-10 ENCOUNTER — Telehealth (HOSPITAL_COMMUNITY): Payer: Self-pay

## 2017-06-10 DIAGNOSIS — M79605 Pain in left leg: Secondary | ICD-10-CM | POA: Insufficient documentation

## 2017-06-10 DIAGNOSIS — M6281 Muscle weakness (generalized): Secondary | ICD-10-CM | POA: Diagnosis not present

## 2017-06-10 DIAGNOSIS — R2689 Other abnormalities of gait and mobility: Secondary | ICD-10-CM | POA: Insufficient documentation
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
## 2017-06-12 ENCOUNTER — Telehealth (HOSPITAL_COMMUNITY): Payer: Self-pay | Admitting: General Practice

## 2017-06-12 ENCOUNTER — Ambulatory Visit (HOSPITAL_COMMUNITY): Payer: 59 | Admitting: Physical Therapy

## 2017-06-12 NOTE — Telephone Encounter (Signed)
06/12/17  Lady caller asked that the appt be cx because he didn't have a ride

## 2017-06-17 ENCOUNTER — Ambulatory Visit (HOSPITAL_COMMUNITY): Payer: 59

## 2017-06-17 ENCOUNTER — Other Ambulatory Visit: Payer: Self-pay

## 2017-06-17 ENCOUNTER — Encounter (HOSPITAL_COMMUNITY): Payer: Self-pay

## 2017-06-17 DIAGNOSIS — R2689 Other abnormalities of gait and mobility: Secondary | ICD-10-CM

## 2017-06-17 DIAGNOSIS — M79605 Pain in left leg: Secondary | ICD-10-CM | POA: Diagnosis not present

## 2017-06-17 DIAGNOSIS — M6281 Muscle weakness (generalized): Secondary | ICD-10-CM
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
## 2017-06-19 ENCOUNTER — Other Ambulatory Visit: Payer: Self-pay

## 2017-06-19 ENCOUNTER — Encounter (HOSPITAL_COMMUNITY): Payer: Self-pay

## 2017-06-19 ENCOUNTER — Ambulatory Visit (HOSPITAL_COMMUNITY): Payer: 59

## 2017-06-19 DIAGNOSIS — M79605 Pain in left leg: Secondary | ICD-10-CM | POA: Diagnosis not present

## 2017-06-19 DIAGNOSIS — R2689 Other abnormalities of gait and mobility: Secondary | ICD-10-CM

## 2017-06-19 DIAGNOSIS — M6281 Muscle weakness (generalized): Secondary | ICD-10-CM | POA: Diagnosis not present
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
## 2017-06-24 ENCOUNTER — Ambulatory Visit (HOSPITAL_COMMUNITY): Payer: 59

## 2017-06-24 DIAGNOSIS — S72352F Displaced comminuted fracture of shaft of left femur, subsequent encounter for open fracture type IIIA, IIIB, or IIIC with routine healing: Secondary | ICD-10-CM | POA: Diagnosis not present

## 2017-06-26 ENCOUNTER — Other Ambulatory Visit: Payer: Self-pay

## 2017-06-26 ENCOUNTER — Ambulatory Visit (HOSPITAL_COMMUNITY): Payer: 59

## 2017-06-26 ENCOUNTER — Encounter (HOSPITAL_COMMUNITY): Payer: Self-pay

## 2017-06-26 DIAGNOSIS — R2689 Other abnormalities of gait and mobility: Secondary | ICD-10-CM | POA: Diagnosis not present

## 2017-06-26 DIAGNOSIS — M79605 Pain in left leg: Secondary | ICD-10-CM

## 2017-06-26 DIAGNOSIS — M6281 Muscle weakness (generalized): Secondary | ICD-10-CM
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
## 2017-07-01 ENCOUNTER — Other Ambulatory Visit: Payer: Self-pay

## 2017-07-01 ENCOUNTER — Ambulatory Visit (HOSPITAL_COMMUNITY): Payer: 59

## 2017-07-01 ENCOUNTER — Encounter (HOSPITAL_COMMUNITY): Payer: Self-pay

## 2017-07-01 DIAGNOSIS — M79605 Pain in left leg: Secondary | ICD-10-CM

## 2017-07-01 DIAGNOSIS — M6281 Muscle weakness (generalized): Secondary | ICD-10-CM | POA: Diagnosis not present

## 2017-07-01 DIAGNOSIS — R2689 Other abnormalities of gait and mobility: Secondary | ICD-10-CM
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
## 2017-07-03 ENCOUNTER — Encounter (HOSPITAL_COMMUNITY): Payer: Self-pay

## 2017-07-03 ENCOUNTER — Ambulatory Visit (HOSPITAL_COMMUNITY): Payer: 59

## 2017-07-03 DIAGNOSIS — R2689 Other abnormalities of gait and mobility: Secondary | ICD-10-CM

## 2017-07-03 DIAGNOSIS — M6281 Muscle weakness (generalized): Secondary | ICD-10-CM

## 2017-07-03 DIAGNOSIS — M79605 Pain in left leg: Secondary | ICD-10-CM
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
## 2017-07-07 ENCOUNTER — Telehealth (HOSPITAL_COMMUNITY): Payer: Self-pay | Admitting: General Practice

## 2017-07-07 ENCOUNTER — Ambulatory Visit (HOSPITAL_COMMUNITY): Payer: 59

## 2017-07-07 NOTE — Telephone Encounter (Signed)
07/07/17  lady called and asked that today's appt be changed to 7/2 because she said his appts were usually on T&Th and may not be able to get a ride today

## 2017-07-08 ENCOUNTER — Ambulatory Visit (HOSPITAL_COMMUNITY): Payer: 59 | Attending: Student | Admitting: Physical Therapy

## 2017-07-08 DIAGNOSIS — M79605 Pain in left leg: Secondary | ICD-10-CM | POA: Insufficient documentation

## 2017-07-08 DIAGNOSIS — R2689 Other abnormalities of gait and mobility: Secondary | ICD-10-CM | POA: Insufficient documentation

## 2017-07-08 DIAGNOSIS — M6281 Muscle weakness (generalized): Secondary | ICD-10-CM | POA: Diagnosis not present
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
## 2017-07-09 ENCOUNTER — Telehealth (HOSPITAL_COMMUNITY): Payer: Self-pay

## 2017-07-09 ENCOUNTER — Ambulatory Visit (HOSPITAL_COMMUNITY): Payer: 59

## 2017-07-09 NOTE — Telephone Encounter (Signed)
No Show #1: I called the patient to check in as he missed his appointment for 4:45 PM today. I informed him that his next scheduled appointment is for Monday July 8th at 5:30 PM, however for him to be re-evaluated by a physical therapist he would need to come in at 4:45 PM on that same date. I asked that if he is able to make this change for his Monday appointment that he call our front office back to confirm.  Valentino Saxonachel Quinn-Brown, PT, DPT Physical Therapist with [facility]  07/09/2017 5:12 PM

## 2017-07-14 ENCOUNTER — Telehealth (HOSPITAL_COMMUNITY): Payer: Self-pay

## 2017-07-14 ENCOUNTER — Ambulatory Visit (HOSPITAL_COMMUNITY): Payer: 59

## 2017-07-14 NOTE — Telephone Encounter (Signed)
cx today lack of transportation

## 2017-07-17 ENCOUNTER — Telehealth (HOSPITAL_COMMUNITY): Payer: Self-pay | Admitting: General Practice

## 2017-07-17 ENCOUNTER — Ambulatory Visit (HOSPITAL_COMMUNITY): Payer: 59

## 2017-07-17 NOTE — Telephone Encounter (Signed)
07/17/17  lady left a message to cx said that the patient didn't have a ride

## 2017-10-07 ENCOUNTER — Encounter (HOSPITAL_COMMUNITY): Payer: Self-pay

## 2017-10-07 NOTE — Therapy (Signed)
[place], [place], [address] Phone: [phone]   Fax:  [phone]  Patient Details  Name: Louis Roberts MRN: [id number] Date of Birth: 08-09-1990 Referring Provider:  No ref. provider found  Encounter Date: 10/07/2017   PHYSICAL THERAPY DISCHARGE SUMMARY  Visits from Start of Care: 10  Current functional level related to goals / functional outcomes: Patient is being discharged from this episode of therapy as he has not returned since his last visit. He was last seen on 07/08/17 and "no-showed" or cancelled his last 3 appointments. He did not reschedule his visits and has not contacted our office.    Remaining deficits: See last treatment note on 07/08/17   Education / Equipment: Patient had been educated on HEP throughout course of therapy and on attendance policy for therapy.  Plan: Patient agrees to discharge.  Patient goals were not met. Patient is being discharged due to meeting the stated rehab goals.  ?????      Kipp Brood, PT, DPT Physical Therapist with [facility]  10/07/2017 12:49 PM    [place], [place], [address] Phone: [phone]   Fax:  [phone]

## 2017-11-24 DIAGNOSIS — S50852D Superficial foreign body of left forearm, subsequent encounter: Secondary | ICD-10-CM | POA: Diagnosis not present

## 2017-12-09 ENCOUNTER — Encounter (HOSPITAL_COMMUNITY): Payer: Self-pay | Admitting: *Deleted

## 2017-12-09 ENCOUNTER — Other Ambulatory Visit: Payer: Self-pay

## 2017-12-09 ENCOUNTER — Other Ambulatory Visit: Payer: Self-pay | Admitting: Orthopedic Surgery

## 2017-12-09 DIAGNOSIS — M795 Residual foreign body in soft tissue: Secondary | ICD-10-CM | POA: Diagnosis not present

## 2017-12-09 DIAGNOSIS — W3400XA Accidental discharge from unspecified firearms or gun, initial encounter: Secondary | ICD-10-CM | POA: Diagnosis not present

## 2017-12-09 NOTE — Progress Notes (Signed)
Pt denies SOB, chest pain, and being under the care of a cardiologist. Pt denies having a stress test, echo and cardiac cath. Pt denies having a chest x ray and EKG within the last year. Pt denies recent labs. Pt made aware to stop taking vitamins, fish oil and herbal medications. Do not take any NSAIDs ie: Ibuprofen, Advil, Naproxen (Aleve), Motrin, BC and Goody Powder. Pt verbalized understanding of all pre-op instructions. 

## 2017-12-10 ENCOUNTER — Ambulatory Visit (HOSPITAL_COMMUNITY): Payer: 59 | Admitting: Anesthesiology

## 2017-12-10 ENCOUNTER — Encounter (HOSPITAL_COMMUNITY): Payer: Self-pay | Admitting: Urology

## 2017-12-10 ENCOUNTER — Encounter (HOSPITAL_COMMUNITY)
Admission: RE | Disposition: A | Discharge status: Home / Self Care | Payer: Self-pay | Source: Ambulatory Visit | Attending: Orthopedic Surgery

## 2017-12-10 ENCOUNTER — Ambulatory Visit (HOSPITAL_COMMUNITY)
Admission: RE | Admit: 2017-12-10 | Discharge: 2017-12-10 | Disposition: A | Discharge status: Home / Self Care | Payer: 59 | Source: Ambulatory Visit | Attending: Orthopedic Surgery | Admitting: Orthopedic Surgery

## 2017-12-10 DIAGNOSIS — F17210 Nicotine dependence, cigarettes, uncomplicated: Secondary | ICD-10-CM | POA: Diagnosis not present

## 2017-12-10 DIAGNOSIS — M795 Residual foreign body in soft tissue: Secondary | ICD-10-CM | POA: Insufficient documentation

## 2017-12-10 DIAGNOSIS — S40852A Superficial foreign body of left upper arm, initial encounter: Secondary | ICD-10-CM | POA: Diagnosis not present

## 2017-12-10 DIAGNOSIS — Y249XXS Unspecified firearm discharge, undetermined intent, sequela: Secondary | ICD-10-CM | POA: Insufficient documentation

## 2017-12-10 DIAGNOSIS — S51802S Unspecified open wound of left forearm, sequela: Secondary | ICD-10-CM | POA: Insufficient documentation

## 2017-12-10 DIAGNOSIS — S51842A Puncture wound with foreign body of left forearm, initial encounter: Secondary | ICD-10-CM | POA: Diagnosis not present

## 2017-12-10 HISTORY — PX: FOREIGN BODY REMOVAL: SHX962

## 2017-12-10 LAB — HEMOGLOBIN: Hemoglobin: 13.5 g/dL (ref 13.0–17.0)

## 2017-12-10 SURGERY — REMOVAL FOREIGN BODY EXTREMITY
Anesthesia: General | Site: Arm Lower | Laterality: Left

## 2017-12-10 MED ORDER — CEFAZOLIN SODIUM-DEXTROSE 2-4 GM/100ML-% IV SOLN
2.0000 g | INTRAVENOUS | Status: AC
  Administered 2017-12-10: 2 g via INTRAVENOUS
  Filled 2017-12-10: qty 100

## 2017-12-10 MED ORDER — FENTANYL CITRATE (PF) 100 MCG/2ML IJ SOLN
INTRAMUSCULAR | Status: DC | PRN
  Administered 2017-12-10 (×2): 50 ug via INTRAVENOUS

## 2017-12-10 MED ORDER — PROMETHAZINE HCL 25 MG/ML IJ SOLN: 6.2500 mg | INTRAMUSCULAR | Status: DC | PRN

## 2017-12-10 MED ORDER — LIDOCAINE 2% (20 MG/ML) 5 ML SYRINGE
INTRAMUSCULAR | Status: DC | PRN
  Administered 2017-12-10: 80 mg via INTRAVENOUS

## 2017-12-10 MED ORDER — MIDAZOLAM HCL 5 MG/5ML IJ SOLN
INTRAMUSCULAR | Status: DC | PRN
  Administered 2017-12-10: 2 mg via INTRAVENOUS

## 2017-12-10 MED ORDER — LACTATED RINGERS IV SOLN
INTRAVENOUS | Status: DC
  Administered 2017-12-10 (×2): via INTRAVENOUS

## 2017-12-10 MED ORDER — LIDOCAINE 2% (20 MG/ML) 5 ML SYRINGE
INTRAMUSCULAR | Status: AC
  Filled 2017-12-10: qty 5

## 2017-12-10 MED ORDER — FENTANYL CITRATE (PF) 250 MCG/5ML IJ SOLN
INTRAMUSCULAR | Status: AC
  Filled 2017-12-10: qty 5

## 2017-12-10 MED ORDER — OXYCODONE HCL 5 MG PO TABS
ORAL_TABLET | ORAL | Status: AC
  Filled 2017-12-10: qty 1

## 2017-12-10 MED ORDER — PROPOFOL 10 MG/ML IV BOLUS
INTRAVENOUS | Status: DC | PRN
  Administered 2017-12-10: 200 mg via INTRAVENOUS

## 2017-12-10 MED ORDER — BUPIVACAINE HCL (PF) 0.25 % IJ SOLN
INTRAMUSCULAR | Status: DC | PRN
  Administered 2017-12-10: 9 mL

## 2017-12-10 MED ORDER — SODIUM CHLORIDE 0.9 % IR SOLN
Status: DC | PRN
  Administered 2017-12-10: 1000 mL

## 2017-12-10 MED ORDER — ONDANSETRON HCL 4 MG/2ML IJ SOLN
INTRAMUSCULAR | Status: DC | PRN
  Administered 2017-12-10: 4 mg via INTRAVENOUS

## 2017-12-10 MED ORDER — ONDANSETRON HCL 4 MG/2ML IJ SOLN
INTRAMUSCULAR | Status: AC
  Filled 2017-12-10: qty 2

## 2017-12-10 MED ORDER — MIDAZOLAM HCL 2 MG/2ML IJ SOLN
INTRAMUSCULAR | Status: AC
  Filled 2017-12-10: qty 2

## 2017-12-10 MED ORDER — PROPOFOL 10 MG/ML IV BOLUS
INTRAVENOUS | Status: AC
  Filled 2017-12-10: qty 20

## 2017-12-10 MED ORDER — HYDROMORPHONE HCL 1 MG/ML IJ SOLN: 0.2500 mg | INTRAMUSCULAR | Status: DC | PRN

## 2017-12-10 MED ORDER — BUPIVACAINE HCL (PF) 0.25 % IJ SOLN
INTRAMUSCULAR | Status: AC
  Filled 2017-12-10: qty 30

## 2017-12-10 MED ORDER — OXYCODONE HCL 5 MG PO TABS
5.0000 mg | ORAL_TABLET | Freq: Once | ORAL | Status: AC | PRN
  Administered 2017-12-10: 5 mg via ORAL

## 2017-12-10 MED ORDER — CHLORHEXIDINE GLUCONATE 4 % EX LIQD: 60.0000 mL | Freq: Once | CUTANEOUS | Status: DC

## 2017-12-10 MED ORDER — PHENYLEPHRINE 40 MCG/ML (10ML) SYRINGE FOR IV PUSH (FOR BLOOD PRESSURE SUPPORT)
PREFILLED_SYRINGE | INTRAVENOUS | Status: AC
  Filled 2017-12-10: qty 10

## 2017-12-10 MED ORDER — OXYCODONE HCL 5 MG/5ML PO SOLN: 5.0000 mg | Freq: Once | ORAL | Status: AC | PRN

## 2017-12-10 MED ORDER — MEPERIDINE HCL 50 MG/ML IJ SOLN: 6.2500 mg | INTRAMUSCULAR | Status: DC | PRN

## 2017-12-10 SURGICAL SUPPLY — 52 items
BANDAGE ACE 3X5.8 VEL STRL LF (GAUZE/BANDAGES/DRESSINGS) ×3 IMPLANT
BANDAGE ACE 4X5 VEL STRL LF (GAUZE/BANDAGES/DRESSINGS) ×3 IMPLANT
BNDG CMPR 9X4 STRL LF SNTH (GAUZE/BANDAGES/DRESSINGS)
BNDG CONFORM 2 STRL LF (GAUZE/BANDAGES/DRESSINGS) IMPLANT
BNDG ESMARK 4X9 LF (GAUZE/BANDAGES/DRESSINGS) IMPLANT
BNDG GAUZE ELAST 4 BULKY (GAUZE/BANDAGES/DRESSINGS) ×3 IMPLANT
CORDS BIPOLAR (ELECTRODE) IMPLANT
COVER SURGICAL LIGHT HANDLE (MISCELLANEOUS) ×6 IMPLANT
COVER WAND RF STERILE (DRAPES) ×3 IMPLANT
CUFF TOURNIQUET SINGLE 18IN (TOURNIQUET CUFF) ×3 IMPLANT
DRAIN PENROSE 1/4X12 LTX STRL (WOUND CARE) IMPLANT
DRAPE OEC MINIVIEW 54X84 (DRAPES) IMPLANT
DRAPE SURG 17X23 STRL (DRAPES) ×3 IMPLANT
DRSG PAD ABDOMINAL 8X10 ST (GAUZE/BANDAGES/DRESSINGS) IMPLANT
DURAPREP 26ML APPLICATOR (WOUND CARE) IMPLANT
ELECT REM PT RETURN 9FT ADLT (ELECTROSURGICAL)
ELECTRODE REM PT RTRN 9FT ADLT (ELECTROSURGICAL) IMPLANT
GAUZE PACKING IODOFORM 1/4X5 (PACKING) IMPLANT
GAUZE SPONGE 4X4 12PLY STRL (GAUZE/BANDAGES/DRESSINGS) ×3 IMPLANT
GAUZE XEROFORM 1X8 LF (GAUZE/BANDAGES/DRESSINGS) ×3 IMPLANT
GLOVE SURG SYN 8.0 (GLOVE) ×3 IMPLANT
GOWN STRL REUS W/ TWL LRG LVL3 (GOWN DISPOSABLE) ×1 IMPLANT
GOWN STRL REUS W/ TWL XL LVL3 (GOWN DISPOSABLE) ×1 IMPLANT
GOWN STRL REUS W/TWL LRG LVL3 (GOWN DISPOSABLE) ×3
GOWN STRL REUS W/TWL XL LVL3 (GOWN DISPOSABLE) ×3
HANDPIECE INTERPULSE COAX TIP (DISPOSABLE)
KIT BASIN OR (CUSTOM PROCEDURE TRAY) ×3 IMPLANT
KIT TURNOVER KIT B (KITS) ×3 IMPLANT
MANIFOLD NEPTUNE II (INSTRUMENTS) ×3 IMPLANT
NEEDLE HYPO 25GX1X1/2 BEV (NEEDLE) IMPLANT
NEEDLE HYPO 25X1 1.5 SAFETY (NEEDLE) ×3 IMPLANT
NS IRRIG 1000ML POUR BTL (IV SOLUTION) ×3 IMPLANT
PACK ORTHO EXTREMITY (CUSTOM PROCEDURE TRAY) ×3 IMPLANT
PAD ARMBOARD 7.5X6 YLW CONV (MISCELLANEOUS) ×6 IMPLANT
PAD CAST 4YDX4 CTTN HI CHSV (CAST SUPPLIES) ×2 IMPLANT
PADDING CAST COTTON 4X4 STRL (CAST SUPPLIES) ×6
SET HNDPC FAN SPRY TIP SCT (DISPOSABLE) IMPLANT
SPONGE LAP 18X18 X RAY DECT (DISPOSABLE) ×3 IMPLANT
SUCTION FRAZIER HANDLE 10FR (MISCELLANEOUS) ×2
SUCTION TUBE FRAZIER 10FR DISP (MISCELLANEOUS) ×1 IMPLANT
SUT ETHILON 4 0 PS 2 18 (SUTURE) ×3 IMPLANT
SUT VICRYL RAPIDE 4/0 PS 2 (SUTURE) IMPLANT
SWAB COLLECTION DEVICE MRSA (MISCELLANEOUS) ×3 IMPLANT
SWAB CULTURE ESWAB REG 1ML (MISCELLANEOUS) ×3 IMPLANT
SYR 20CC LL (SYRINGE) IMPLANT
SYR CONTROL 10ML LL (SYRINGE) ×3 IMPLANT
TOWEL OR 17X24 6PK STRL BLUE (TOWEL DISPOSABLE) ×3 IMPLANT
TOWEL OR 17X26 10 PK STRL BLUE (TOWEL DISPOSABLE) ×3 IMPLANT
TUBE CONNECTING 12'X1/4 (SUCTIONS) ×1
TUBE CONNECTING 12X1/4 (SUCTIONS) ×2 IMPLANT
UNDERPAD 30X30 (UNDERPADS AND DIAPERS) ×3 IMPLANT
YANKAUER SUCT BULB TIP NO VENT (SUCTIONS) ×3 IMPLANT
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
## 2017-12-10 NOTE — Op Note (Signed)
Please see dictated report [phone]
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
## 2017-12-10 NOTE — Anesthesia Procedure Notes (Signed)
Procedure Name: LMA Insertion Date/Time: 12/10/2017 2:10 PM Performed by: Dorie RankQuinn, Tuyen Uncapher M, CRNA Pre-anesthesia Checklist: Patient identified, Emergency Drugs available, Suction available, Patient being monitored and Timeout performed Patient Re-evaluated:Patient Re-evaluated prior to induction Oxygen Delivery Method: Circle system utilized Preoxygenation: Pre-oxygenation with 100% oxygen Induction Type: IV induction Ventilation: Mask ventilation without difficulty LMA: LMA inserted LMA Size: 5.0 Number of attempts: 1 Placement Confirmation: breath sounds checked- equal and bilateral,  positive ETCO2 and ETT inserted through vocal cords under direct vision Tube secured with: Tape Dental Injury: Teeth and Oropharynx as per pre-operative assessment

## 2017-12-11 ENCOUNTER — Encounter (HOSPITAL_COMMUNITY): Payer: Self-pay | Admitting: Orthopedic Surgery

## 2017-12-15 LAB — AEROBIC/ANAEROBIC CULTURE W GRAM STAIN (SURGICAL/DEEP WOUND): Culture: NO GROWTH

## 2018-03-17 DIAGNOSIS — S50852D Superficial foreign body of left forearm, subsequent encounter: Secondary | ICD-10-CM | POA: Diagnosis not present

## 2018-03-17 DIAGNOSIS — R202 Paresthesia of skin: Secondary | ICD-10-CM | POA: Diagnosis not present

## 2018-06-10 DIAGNOSIS — Z203 Contact with and (suspected) exposure to rabies: Secondary | ICD-10-CM | POA: Diagnosis not present

## 2018-06-10 DIAGNOSIS — Z113 Encounter for screening for infections with a predominantly sexual mode of transmission: Secondary | ICD-10-CM | POA: Diagnosis not present

## 2018-10-30 DIAGNOSIS — J00 Acute nasopharyngitis [common cold]: Secondary | ICD-10-CM | POA: Diagnosis not present
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
